# Patient Record
Sex: Female | Born: 1965 | Race: White | Hispanic: No | Marital: Married | Smoking: Never smoker
Health system: Southern US, Community
[De-identification: ages and names within clinical notes are randomized; demographics above are authoritative.]

## PROBLEM LIST (undated history)

## (undated) HISTORY — PX: BACK SURGERY: SHX140

## (undated) HISTORY — PX: ABDOMINAL HYSTERECTOMY: SHX81

---

## 2010-03-05 ENCOUNTER — Emergency Department: Payer: Self-pay | Admitting: Unknown Physician Specialty

## 2010-04-19 ENCOUNTER — Emergency Department: Payer: Self-pay | Admitting: Internal Medicine

## 2010-04-26 ENCOUNTER — Emergency Department: Payer: Self-pay | Admitting: Emergency Medicine

## 2010-05-13 ENCOUNTER — Emergency Department: Payer: Self-pay | Admitting: Unknown Physician Specialty

## 2010-06-19 ENCOUNTER — Emergency Department: Payer: Self-pay | Admitting: Emergency Medicine

## 2010-06-29 ENCOUNTER — Emergency Department: Payer: Self-pay | Admitting: Emergency Medicine

## 2010-09-13 ENCOUNTER — Emergency Department: Payer: Self-pay | Admitting: Emergency Medicine

## 2011-06-09 IMAGING — CR DG LUMBAR SPINE 2-3V
1 series · 3 of 3 positions shown · non-contrast
Comparison: none

REASON FOR EXAM: fall with lower back pain
COMMENTS:

[Series 1: view not recorded · 0.17mm/px · 3 of 3 slices shown]
[im 1/3]
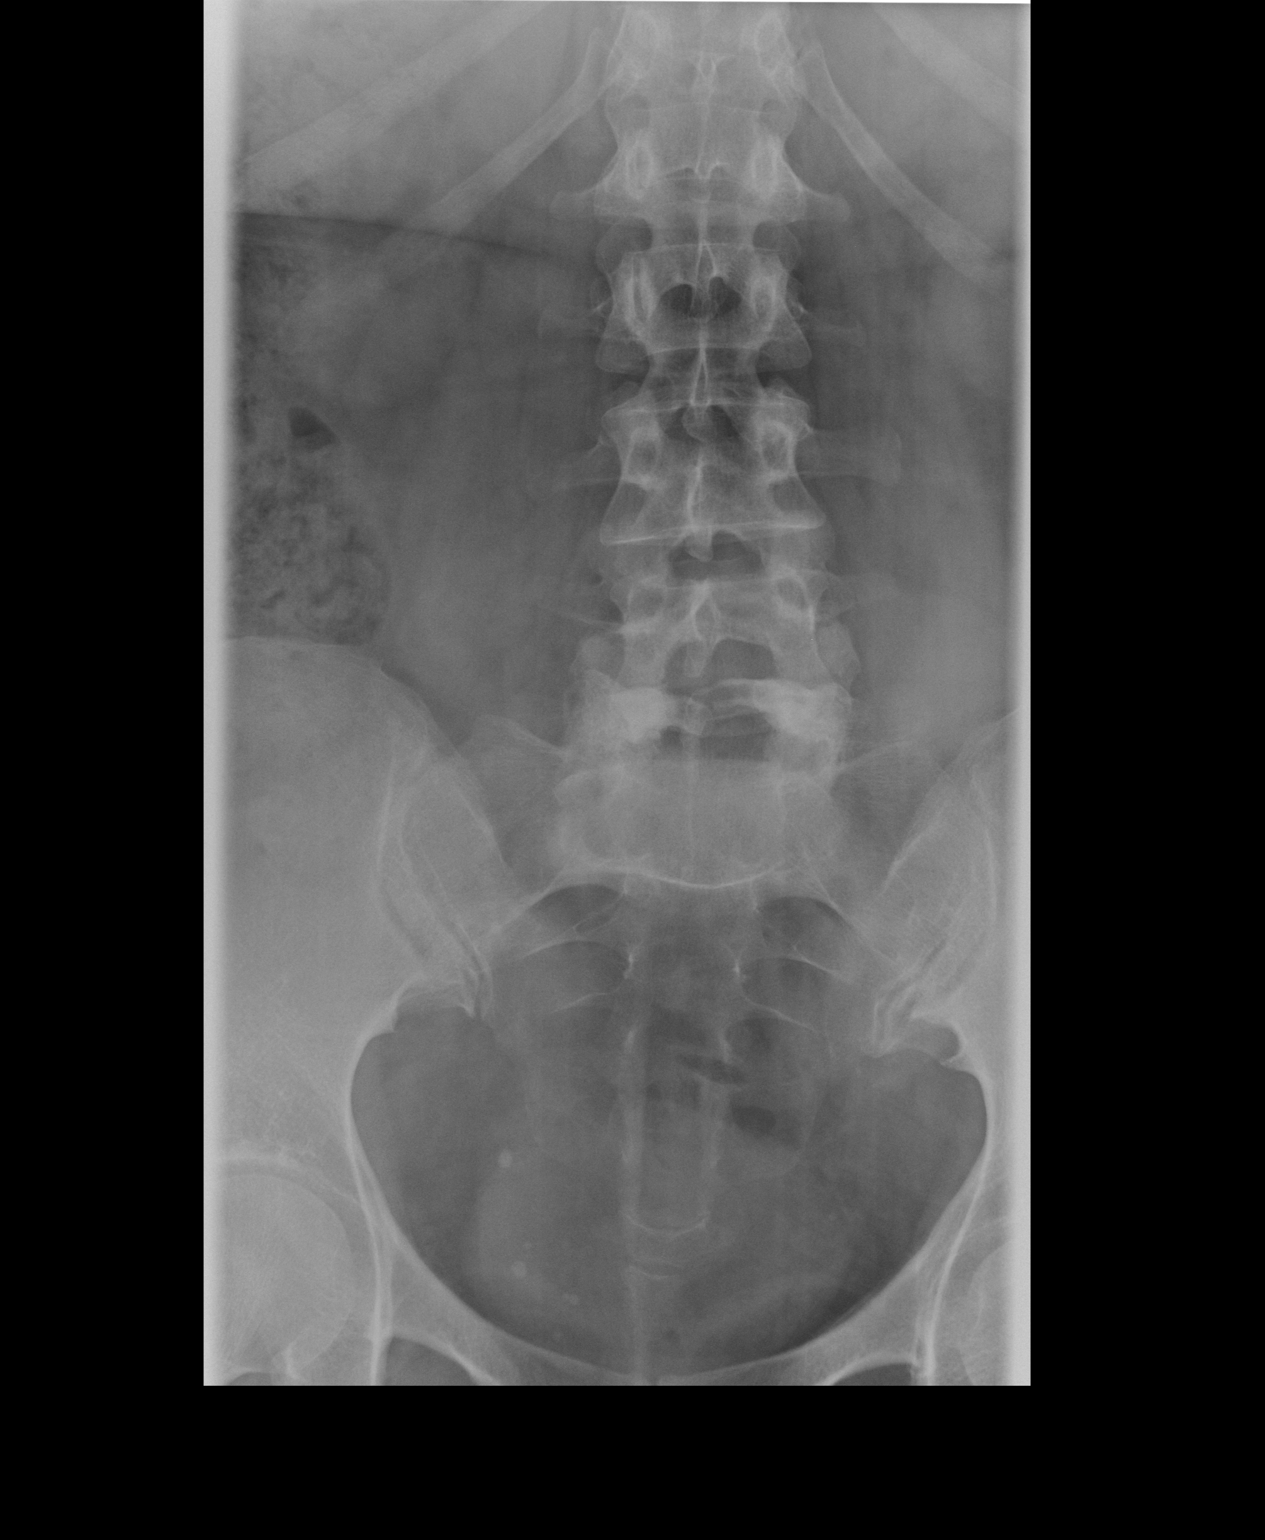
[im 2/3]
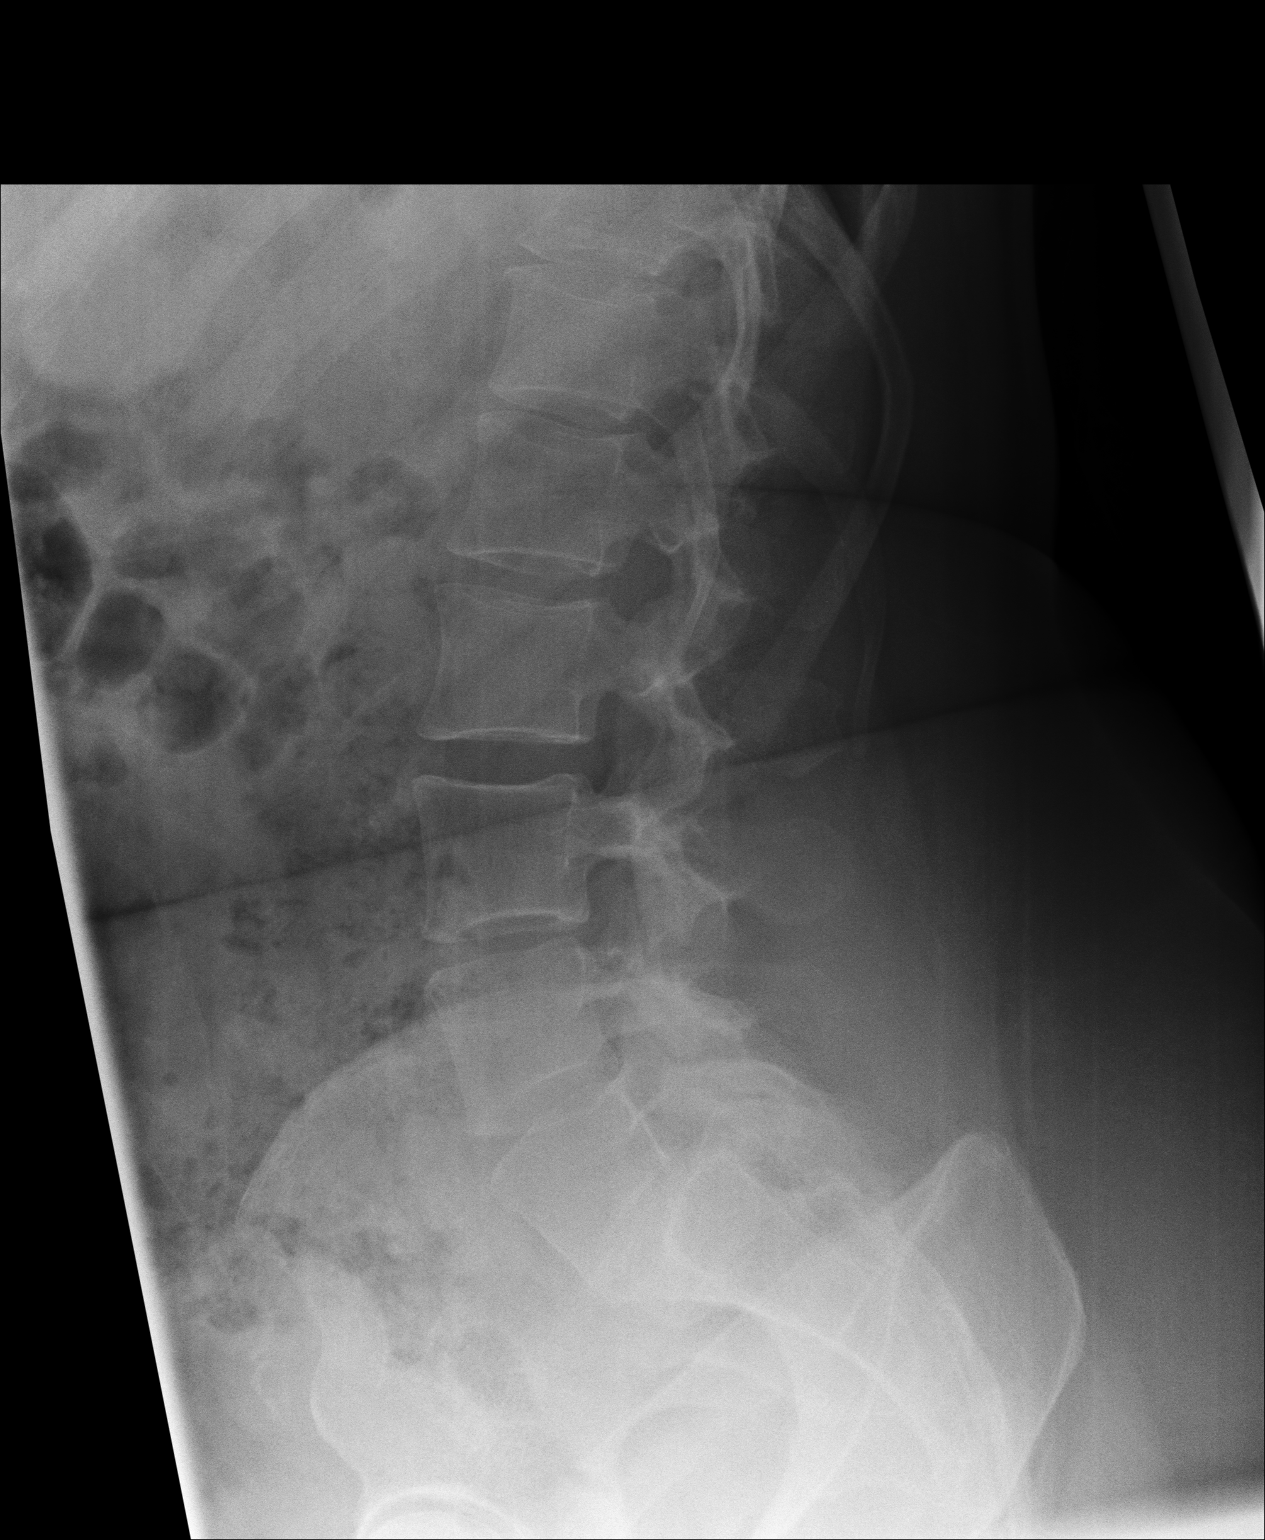
[im 3/3]
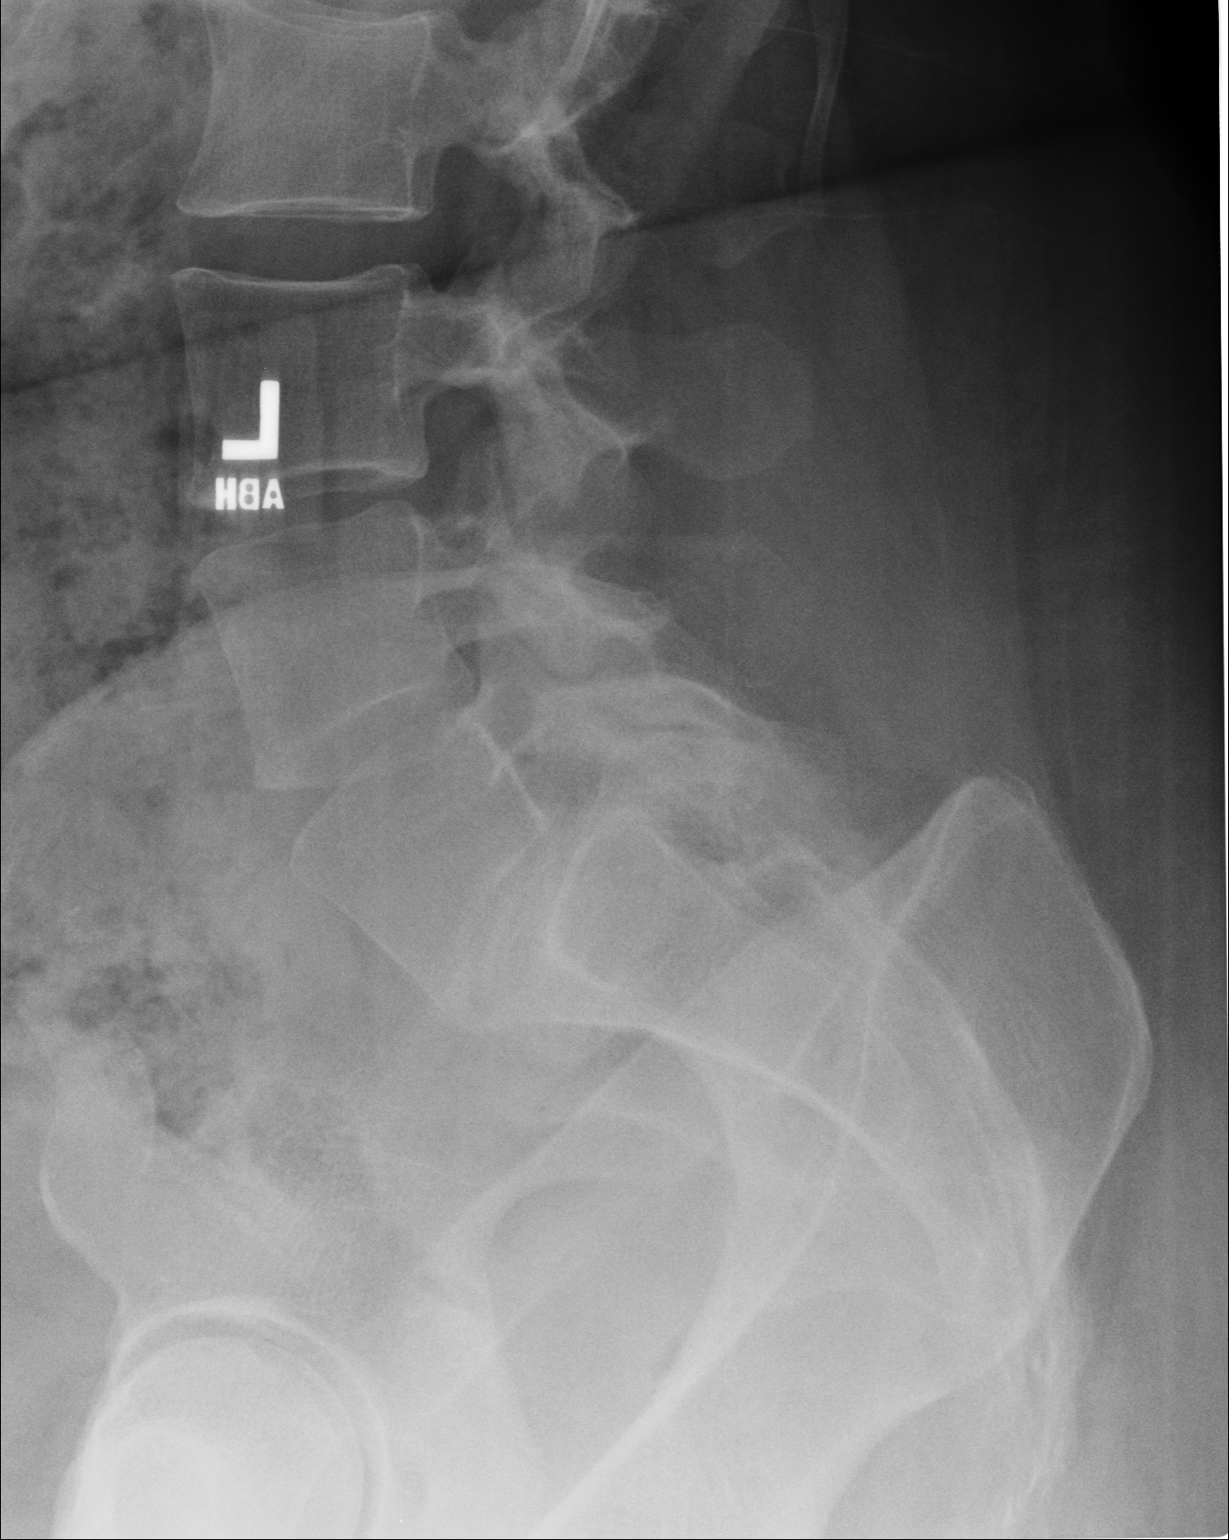

[3 of 3 positions shown; findings below may reference images not displayed]

PROCEDURE:     DXR - DXR LUMBAR SPINE AP AND LATERAL  - June 19, 2010 [DATE]

RESULT:     Spinal alignment appears to be maintained on the AP view. There
is, however, anterolisthesis of L5 on S1 which appears to be unchanged
compared to images of 05/13/2010.. The vertebral body heights and
intervertebral disc spaces appear to be maintained except for disc space
narrowing at the L5-S1 level similar to that seen previously.
IMPRESSION: Please see above. No acute bony abnormality evident. MRI is
available for followup if desired..

## 2013-09-18 ENCOUNTER — Emergency Department: Payer: Self-pay | Admitting: Emergency Medicine

## 2015-06-18 ENCOUNTER — Emergency Department
Admission: EM | Admit: 2015-06-18 | Discharge: 2015-06-18 | Disposition: A | Discharge status: Home / Self Care | Payer: Self-pay | Attending: Emergency Medicine | Admitting: Emergency Medicine

## 2015-06-18 ENCOUNTER — Emergency Department: Payer: Self-pay

## 2015-06-18 DIAGNOSIS — S92501A Displaced unspecified fracture of right lesser toe(s), initial encounter for closed fracture: Secondary | ICD-10-CM

## 2015-06-18 DIAGNOSIS — Y9289 Other specified places as the place of occurrence of the external cause: Secondary | ICD-10-CM | POA: Insufficient documentation

## 2015-06-18 DIAGNOSIS — Y9389 Activity, other specified: Secondary | ICD-10-CM | POA: Insufficient documentation

## 2015-06-18 DIAGNOSIS — Y998 Other external cause status: Secondary | ICD-10-CM | POA: Insufficient documentation

## 2015-06-18 DIAGNOSIS — W208XXA Other cause of strike by thrown, projected or falling object, initial encounter: Secondary | ICD-10-CM | POA: Insufficient documentation

## 2015-06-18 DIAGNOSIS — S90121A Contusion of right lesser toe(s) without damage to nail, initial encounter: Secondary | ICD-10-CM | POA: Insufficient documentation

## 2015-06-18 DIAGNOSIS — S92591A Other fracture of right lesser toe(s), initial encounter for closed fracture: Secondary | ICD-10-CM | POA: Insufficient documentation

## 2015-06-18 MED ORDER — HYDROCODONE-ACETAMINOPHEN 5-325 MG PO TABS
1.0000 | ORAL_TABLET | Freq: Once | ORAL | Status: AC
  Administered 2015-06-18: 1 via ORAL
  Filled 2015-06-18: qty 1

## 2015-06-18 MED ORDER — HYDROCODONE-ACETAMINOPHEN 5-325 MG PO TABS: 1.0000 | ORAL_TABLET | ORAL | Status: DC | PRN

## 2015-06-18 NOTE — ED Notes (Signed)
Patient dropped nightstand on right pinkie toe last night.

## 2015-06-18 NOTE — ED Provider Notes (Signed)
[facility] Emergency Department Provider Note  ____________________________________________  Time seen: Approximately 9:48 PM  I have reviewed the triage vital signs and the nursing notes.   HISTORY  Chief Complaint Toe Injury   HPI Kristen Luna is a 50 y.o. female is here with complaint of right fifth toe pain. Patient states she dropped night stand on her fifth toe last evening. She has used ice and heat to her toe without any relief of pain. She is also taking some Tylenol this evening without any relief with the last dose being at 6 PM. She denies any previous fractures to her today. Currently she rates her pain as a 9 out of 10.   History reviewed. No pertinent past medical history.  There are no active problems to display for this patient.   Past Surgical History  Procedure Laterality Date  . Abdominal hysterectomy    . Back surgery      Current Outpatient Rx  Name  Route  Sig  Dispense  Refill  . HYDROcodone-acetaminophen (NORCO/VICODIN) 5-325 MG tablet   Oral   Take 1 tablet by mouth every 4 (four) hours as needed for moderate pain.   20 tablet   0     Allergies Review of patient's allergies indicates no known allergies.  No family history on file.  Social History Social History  Substance Use Topics  . Smoking status: Never Smoker   . Smokeless tobacco: Never Used  . Alcohol Use: No    Review of Systems Constitutional: No fever/chills Cardiovascular: Denies chest pain. Respiratory: Denies shortness of breath. Gastrointestinal:   No nausea, no vomiting.  Musculoskeletal: Positive right fifth toe pain Skin: Negative for rash. Neurological: Negative for headaches, focal weakness or numbness.  10-point ROS otherwise negative.  ____________________________________________   PHYSICAL EXAM:  VITAL SIGNS: ED Triage Vitals  Enc Vitals Group     BP 06/18/15 2105 135/78 mmHg     Pulse Rate 06/18/15 2105 79     Resp  06/18/15 2105 16     Temp 06/18/15 2105 97.7 F (36.5 C)     Temp Source 06/18/15 2105 Oral     SpO2 06/18/15 2105 98 %     Weight 06/18/15 2105 290 lb (131.543 kg)     Height 06/18/15 2105  (1.753 m)     Head Cir --      Peak Flow --      Pain Score 06/18/15 2106 9     Pain Loc --      Pain Edu? --      Excl. in GC? --     Constitutional: Alert and oriented. Well appearing and in no acute distress. Eyes: Conjunctivae are normal. PERRL. EOMI. Head: Atraumatic. Nose: No congestion/rhinnorhea. Neck: No stridor.   Cardiovascular: Normal rate, regular rhythm. Grossly normal heart sounds.  Good peripheral circulation. Respiratory: Normal respiratory effort.  No retractions. Lungs CTAB. Musculoskeletal: Right fifth toe is swollen, tender, ecchymotic. There is no gross deformity and alignment is good. Neurologic:  Normal speech and language. No gross focal neurologic deficits are appreciated. No gait instability. Skin:  Skin is warm, dry and intact. Ecchymosis and edema as noted above. Psychiatric: Mood and affect are normal. Speech and behavior are normal.  ____________________________________________   LABS (all labs ordered are listed, but only abnormal results are displayed)  Labs Reviewed - No data to display   RADIOLOGY  X-ray right fifth toe shows comminuted fracture of the distal phalanx of the fifth  toe per radiologist and reviewed by me. I, Tommi Rumps, personally viewed and evaluated these images (plain radiographs) as part of my medical decision making, as well as reviewing the written report by the radiologist. ____________________________________________   PROCEDURES  Procedure(s) performed: None  Critical Care performed: No  ____________________________________________   INITIAL IMPRESSION / ASSESSMENT AND PLAN / ED COURSE  Pertinent labs & imaging results that were available during my care of the patient were reviewed by me and considered in my  medical decision making (see chart for details).  Patient was given a prescription for Norco as needed for pain. She is encouraged to ice and elevate as needed for swelling and pain. Buddy tape was applied and she was placed in a postop shoe. She is to follow-up with Dr. Orland Jarred if any continued problems. ____________________________________________   FINAL CLINICAL IMPRESSION(S) / ED DIAGNOSES  Final diagnoses:  Fracture of fifth toe, right, closed, initial encounter      Tommi Rumps, PA-C 06/18/15 2302  Jene Every, MD 06/18/15 2359

## 2015-06-18 NOTE — Discharge Instructions (Signed)
Ice and elevate as needed for swelling. Buddy taped toes for support Wear wooden shoe until able to wear your shoes.

## 2015-09-26 ENCOUNTER — Encounter: Payer: Self-pay | Admitting: Medical Oncology

## 2015-09-26 ENCOUNTER — Emergency Department
Admission: EM | Admit: 2015-09-26 | Discharge: 2015-09-26 | Disposition: A | Discharge status: Home / Self Care | Payer: Self-pay | Attending: Emergency Medicine | Admitting: Emergency Medicine

## 2015-09-26 DIAGNOSIS — M5441 Lumbago with sciatica, right side: Secondary | ICD-10-CM | POA: Insufficient documentation

## 2015-09-26 DIAGNOSIS — M5431 Sciatica, right side: Secondary | ICD-10-CM

## 2015-09-26 MED ORDER — MELOXICAM 15 MG PO TABS: 15.0000 mg | ORAL_TABLET | Freq: Every day | ORAL | Status: DC

## 2015-09-26 MED ORDER — PREDNISONE 10 MG (21) PO TBPK: ORAL_TABLET | ORAL | Status: DC

## 2015-09-26 MED ORDER — OXYCODONE-ACETAMINOPHEN 5-325 MG PO TABS: 1.0000 | ORAL_TABLET | Freq: Four times a day (QID) | ORAL | Status: DC | PRN

## 2015-09-26 NOTE — ED Notes (Signed)
Rt sided lower back pain with radiation of pain into rt leg x 2 weeks.

## 2015-09-26 NOTE — ED Provider Notes (Signed)
[facility] Emergency Department Provider Note ____________________________________________  Time seen: Approximately 4:26 PM  I have reviewed the triage vital signs and the nursing notes.   HISTORY  Chief Complaint Back Pain    HPI Kristen Luna is a 50 y.o. female who presents to the emergency department for sudden onset of right lower back pain with radiation into the right buttock and down to her right foot. Symptoms started approximately 2 weeks ago and have not been relieved with heating pad, ice pack, or warm soaks. She is also taken Tylenol and ibuprofen without relief. She states that she works 7 days a week and stands on her feet multiple hours per day. She reports having a history of back surgery in 2004 related to multiple herniated disks. She denies new injury.  History reviewed. No pertinent past medical history.  There are no active problems to display for this patient.   Past Surgical History  Procedure Laterality Date  . Abdominal hysterectomy    . Back surgery      Current Outpatient Rx  Name  Route  Sig  Dispense  Refill  . meloxicam (MOBIC) 15 MG tablet   Oral   Take 1 tablet (15 mg total) by mouth daily.   30 tablet   0   . oxyCODONE-acetaminophen (ROXICET) 5-325 MG tablet   Oral   Take 1 tablet by mouth every 6 (six) hours as needed.   12 tablet   0   . predniSONE (STERAPRED UNI-PAK 21 TAB) 10 MG (21) TBPK tablet      Take 6 tablets on day 1 Take 5 tablets on day 2 Take 4 tablets on day 3 Take 3 tablets on day 4 Take 2 tablets on day 5 Take 1 tablet on day 6   21 tablet   0     Allergies Review of patient's allergies indicates no known allergies.  No family history on file.  Social History Social History  Substance Use Topics  . Smoking status: Never Smoker   . Smokeless tobacco: Never Used  . Alcohol Use: No    Review of Systems Constitutional: No recent illness. Cardiovascular: Denies chest pain or  palpitations. Respiratory: Denies shortness of breath. Musculoskeletal: Pain in Right lower back with radiation to the right foot. Skin: Negative for rash, wound, lesion. Neurological: Negative for focal weakness or numbness.  ____________________________________________   PHYSICAL EXAM:  VITAL SIGNS: ED Triage Vitals  Enc Vitals Group     BP 09/26/15 1600 141/82 mmHg     Pulse Rate 09/26/15 1600 76     Resp 09/26/15 1600 18     Temp 09/26/15 1600 97.9 F (36.6 C)     Temp Source 09/26/15 1600 Oral     SpO2 09/26/15 1600 97 %     Weight 09/26/15 1558 290 lb (131.543 kg)     Height --      Head Cir --      Peak Flow --      Pain Score 09/26/15 1558 8     Pain Loc --      Pain Edu? --      Excl. in GC? --     Constitutional: Alert and oriented. Well appearing and in no acute distress. Eyes: Conjunctivae are normal. EOMI. Head: Atraumatic. Neck: No stridor.  Respiratory: Normal respiratory effort.   Musculoskeletal: Straight leg raise positive at approximately the 30. Otherwise, range of motion is normal. Neurologic:  Normal speech and language. No gross focal  neurologic deficits are appreciated. Speech is normal. No gait instability. Skin:  Skin is warm, dry and intact. Atraumatic. Psychiatric: Mood and affect are normal. Speech and behavior are normal.  ____________________________________________   LABS (all labs ordered are listed, but only abnormal results are displayed)  Labs Reviewed - No data to display ____________________________________________  RADIOLOGY  Not indicated ____________________________________________   PROCEDURES  Procedure(s) performed: None   ____________________________________________   INITIAL IMPRESSION / ASSESSMENT AND PLAN / ED COURSE  Pertinent labs & imaging results that were available during my care of the patient were reviewed by me and considered in my medical decision making (see chart for details).  Patient will  receive a prescription for prednisone taper dose pack, meloxicam, and Roxicodone. She was encouraged to rest ice and elevate the right lower extremity for the next couple of days. She was encouraged to follow-up with orthopedics for symptoms that are not improving over the next 2 weeks. She was advised to return to the emergency department for symptoms that change or worsen if she is unable schedule an appointment with orthopedics or primary care provider. ____________________________________________   FINAL CLINICAL IMPRESSION(S) / ED DIAGNOSES  Final diagnoses:  Sciatica, right side       Chinita Pester, FNP 09/26/15 1631  Jene Every, MD 09/26/15 1949

## 2015-10-14 ENCOUNTER — Emergency Department
Admission: EM | Admit: 2015-10-14 | Discharge: 2015-10-14 | Disposition: A | Discharge status: Home / Self Care | Payer: Self-pay | Attending: Emergency Medicine | Admitting: Emergency Medicine

## 2015-10-14 ENCOUNTER — Emergency Department: Payer: Self-pay

## 2015-10-14 ENCOUNTER — Encounter: Payer: Self-pay | Admitting: Emergency Medicine

## 2015-10-14 DIAGNOSIS — M5441 Lumbago with sciatica, right side: Secondary | ICD-10-CM | POA: Insufficient documentation

## 2015-10-14 MED ORDER — METHOCARBAMOL 500 MG PO TABS
750.0000 mg | ORAL_TABLET | Freq: Once | ORAL | Status: AC
  Administered 2015-10-14: 750 mg via ORAL
  Filled 2015-10-14: qty 2

## 2015-10-14 MED ORDER — OXYCODONE-ACETAMINOPHEN 5-325 MG PO TABS: 1.0000 | ORAL_TABLET | Freq: Four times a day (QID) | ORAL | Status: DC | PRN

## 2015-10-14 MED ORDER — OXYCODONE-ACETAMINOPHEN 5-325 MG PO TABS
1.0000 | ORAL_TABLET | Freq: Once | ORAL | Status: AC
  Administered 2015-10-14: 1 via ORAL
  Filled 2015-10-14: qty 1

## 2015-10-14 MED ORDER — PREDNISONE 10 MG (21) PO TBPK: ORAL_TABLET | ORAL | Status: DC

## 2015-10-14 NOTE — ED Provider Notes (Signed)
[facility] Emergency Department Provider Note  ____________________________________________  Time seen: Approximately 4:02 PM  I have reviewed the triage vital signs and the nursing notes.   HISTORY  Chief Complaint Back Pain    HPI Kristen ManeLisa Luna is a 50 y.o. female who presents with worsening back pain over the last couple days. She was seen 2 weeks ago for similar complaint and improved with prednisone taper.  She was helping someone move yesterday and symptoms worsened again. Right lower back pain with radiation down the right leg. She has a history of back surgery in 2004 for herniated disc. No current fevers or chills, abdominal pain, urinary symptoms, chest pain, shortness of breath.   History reviewed. No pertinent past medical history.  There are no active problems to display for this patient.   Past Surgical History  Procedure Laterality Date  . Abdominal hysterectomy    . Back surgery      Current Outpatient Rx  Name  Route  Sig  Dispense  Refill  . meloxicam (MOBIC) 15 MG tablet   Oral   Take 1 tablet (15 mg total) by mouth daily.   30 tablet   0   . oxyCODONE-acetaminophen (ROXICET) 5-325 MG tablet   Oral   Take 1 tablet by mouth every 6 (six) hours as needed.   20 tablet   0   . predniSONE (STERAPRED UNI-PAK 21 TAB) 10 MG (21) TBPK tablet      6 tablets on day 1, 5 tablets on day 2, 4 tablets on day 3, etc...   21 tablet   0     Allergies Review of patient's allergies indicates no known allergies.  No family history on file.  Social History Social History  Substance Use Topics  . Smoking status: Never Smoker   . Smokeless tobacco: Never Used  . Alcohol Use: No    Review of Systems Constitutional: No fever/chills Eyes: No visual changes. ENT: No sore throat. Cardiovascular: Denies chest pain. Respiratory: Denies shortness of breath. Gastrointestinal: No abdominal pain.  No nausea, no vomiting.  No diarrhea.  No  constipation. Genitourinary: Negative for dysuria. Musculoskeletal:per HPI Skin: Negative for rash. Neurological: Negative for headaches, focal weakness or numbness. 10-point ROS otherwise negative.  ____________________________________________   PHYSICAL EXAM:  VITAL SIGNS: ED Triage Vitals  Enc Vitals Group     BP 10/14/15 1531 131/81 mmHg     Pulse Rate 10/14/15 1531 82     Resp 10/14/15 1531 20     Temp 10/14/15 1531 98.1 F (36.7 C)     Temp Source 10/14/15 1531 Oral     SpO2 10/14/15 1531 98 %     Weight 10/14/15 1531 293 lb (132.904 kg)     Height 10/14/15 1531 5\' 8"  (1.727 m)     Head Cir --      Peak Flow --      Pain Score 10/14/15 1544 7     Pain Loc --      Pain Edu? --      Excl. in GC? --     Constitutional: Alert and oriented. Well appearing and in no acute distress. Eyes: Conjunctivae are normal. PERRL. EOMI. Ears:  Clear with normal landmarks. No erythema. Head: Atraumatic. Nose: No congestion/rhinnorhea. Mouth/Throat: Mucous membranes are moist.  Oropharynx non-erythematous. No lesions. Neck:  Supple.  No adenopathy.   Cardiovascular: Normal rate, regular rhythm. Grossly normal heart sounds.  Good peripheral circulation. Respiratory: Normal respiratory effort.  No retractions.  Lungs CTAB. Musculoskeletal:    tender over the lumbar spine and paraspinal muscles on right.  rom intact.  Neg SLR bilateral. nontender over the greater trochanter bilateral. Neurologic:  Normal speech and language. No gross focal neurologic deficits are appreciated. No gait instability. Skin:  Skin is warm, dry and intact. No rash noted. Psychiatric: Mood and affect are normal. Speech and behavior are normal.  ____________________________________________   LABS (all labs ordered are listed, but only abnormal results are displayed)  Labs Reviewed - No data to  display ____________________________________________  EKG   ____________________________________________  RADIOLOGY  CLINICAL DATA: Progressive lumbago with right-sided radicular symptoms for approximately 2 week  EXAM: LUMBAR SPINE - 2-3 VIEW  COMPARISON: June 19, 2010  FINDINGS: Frontal, lateral, and spot lumbosacral lateral images were obtained. There are 5 non-rib-bearing lumbar type vertebral bodies. There is no fracture. There is persistent 6 mm of anterolisthesis of L5 on S1. No other spondylolisthesis is evident. There is stable disc space narrowing at L3-4, L4-5, and L5-S1. No erosion identified.  IMPRESSION: Disc space narrowing at L3-4, L4-5, and L5-S1, stable. Stable anterolisthesis of L5 on S1, likely due to underlying spondylosis. No fracture evident.   Electronically Signed  By: Bretta Bang III M.D.  On: 10/14/2015 16:23  ____________________________________________   PROCEDURES  Procedure(s) performed: None  Critical Care performed: No  ____________________________________________   INITIAL IMPRESSION / ASSESSMENT AND PLAN / ED COURSE  Pertinent labs & imaging results that were available during my care of the patient were reviewed by me and considered in my medical decision making (see chart for details).  50 year old with acute right lower back pain. Was seen 2 weeks ago for the same. Her symptoms didn't improve, however worsened again after helping a friend move yesterday. Stable x-rays today. Given a prednisone taper. Encouraged follow-up with orthopedist. ____________________________________________   FINAL CLINICAL IMPRESSION(S) / ED DIAGNOSES  Final diagnoses:  Right-sided low back pain with right-sided sciatica      Ignacia Bayley, PA-C 10/14/15 1652  Sharman Cheek, MD 10/15/15 0011

## 2015-10-14 NOTE — ED Notes (Signed)
Right back pain rad down leg.

## 2015-10-14 NOTE — ED Notes (Signed)
Pt verbalized understanding of discharge instructions. NAD at this time. 

## 2015-10-14 NOTE — Discharge Instructions (Signed)

## 2015-11-12 ENCOUNTER — Emergency Department
Admission: EM | Admit: 2015-11-12 | Discharge: 2015-11-12 | Disposition: A | Discharge status: Home / Self Care | Payer: Self-pay | Attending: Emergency Medicine | Admitting: Emergency Medicine

## 2015-11-12 ENCOUNTER — Encounter: Payer: Self-pay | Admitting: Emergency Medicine

## 2015-11-12 DIAGNOSIS — M545 Low back pain: Secondary | ICD-10-CM | POA: Insufficient documentation

## 2015-11-12 DIAGNOSIS — G8929 Other chronic pain: Secondary | ICD-10-CM | POA: Insufficient documentation

## 2015-11-12 MED ORDER — KETOROLAC TROMETHAMINE 30 MG/ML IJ SOLN
30.0000 mg | Freq: Once | INTRAMUSCULAR | Status: DC
  Filled 2015-11-12: qty 1

## 2015-11-12 MED ORDER — ETODOLAC 400 MG PO TABS: 400.0000 mg | ORAL_TABLET | Freq: Two times a day (BID) | ORAL | Status: AC

## 2015-11-12 MED ORDER — METHOCARBAMOL 500 MG PO TABS: ORAL_TABLET | ORAL | Status: AC

## 2015-11-12 MED ORDER — METHOCARBAMOL 500 MG PO TABS
1000.0000 mg | ORAL_TABLET | Freq: Once | ORAL | Status: AC
  Administered 2015-11-12: 1000 mg via ORAL
  Filled 2015-11-12: qty 2

## 2015-11-12 MED ORDER — KETOROLAC TROMETHAMINE 30 MG/ML IJ SOLN
30.0000 mg | Freq: Once | INTRAMUSCULAR | Status: AC
  Administered 2015-11-12: 30 mg via INTRAMUSCULAR

## 2015-11-12 NOTE — Discharge Instructions (Signed)
[facility]. Moist heat or ice to your back as needed for comfort. Begin taking medication as directed. Etodolac 400 mg twice a day with food and Robaxin one or 2 tablets 4 times a day as needed for muscle spasms. You may also follow-up with Dr. Rosita KeaMenz for your chronic back pain. He is office phone number is also listed on your discharge papers.

## 2015-11-12 NOTE — ED Notes (Signed)

## 2015-11-12 NOTE — ED Provider Notes (Signed)
[facility] Emergency Department Provider Note  ___________________________________________  Time seen: Approximately 1:14 PM  I have reviewed the triage vital signs and the nursing notes.   HISTORY  Chief Complaint Back Pain   HPI Kristen Luna is a 50 y.o. female is here with complaint of low back pain. Patient states that her chronic low back pain began again last evening without injury. She denies any paresthesias or incontinence of bowel or bladder. Patient has a history of sciatica and back pain. [facility] but has not followed up since nor has she seen an [place] in [place]. Patient was seen in the emergency room on 6/16 for the same. At that time she was given prednisone and Percocet. Patient also gives history that she took a prednisone pack prior to that ER visit as well.  Currently she rates her back pain is 9/10. She states she has not followed up with orthopedics or a primary care doctor due to financial burden's. She was ambulatory without assistance in the emergency room.   History reviewed. No pertinent past medical history.  There are no active problems to display for this patient.   Past Surgical History  Procedure Laterality Date  . Abdominal hysterectomy    . Back surgery      Current Outpatient Rx  Name  Route  Sig  Dispense  Refill  . etodolac (LODINE) 400 MG tablet   Oral   Take 1 tablet (400 mg total) by mouth 2 (two) times daily.   20 tablet   0   . methocarbamol (ROBAXIN) 500 MG tablet      1-2 tablets qid prn muscle spasms   30 tablet   0     Allergies Review of patient's allergies indicates no known allergies.  No family history on file.  Social History Social History  Substance Use Topics  . Smoking status: Never Smoker   . Smokeless tobacco: Never Used  . Alcohol Use: No    Review of Systems Constitutional: No fever/chills Cardiovascular:  Denies chest pain. Respiratory: Denies shortness of breath. Gastrointestinal: No abdominal pain.  No nausea, no vomiting.  Genitourinary: Negative for dysuria. Musculoskeletal: Positive for acute and chronic back pain. Skin: Negative for rash. Neurological: Negative for headaches, focal weakness or numbness.  10-point ROS otherwise negative.  ____________________________________________   PHYSICAL EXAM:  VITAL SIGNS: ED Triage Vitals  Enc Vitals Group     BP 11/12/15 1259 143/87 mmHg     Pulse Rate 11/12/15 1259 81     Resp 11/12/15 1259 18     Temp 11/12/15 1259 98.1 F (36.7 C)     Temp Source 11/12/15 1259 Oral     SpO2 11/12/15 1259 100 %     Weight 11/12/15 1259 193 lb (87.544 kg)     Height 11/12/15 1259 5\' 9"  (1.753 m)     Head Cir --      Peak Flow --      Pain Score 11/12/15 1301 9     Pain Loc --      Pain Edu? --      Excl. in GC? --     Constitutional: Alert and oriented. Well appearing and in no acute distress.Patient sitting on the stretcher. Patient morbidly obese. Eyes: Conjunctivae are normal. PERRL. EOMI. Head: Atraumatic. Nose: No congestion/rhinnorhea. Neck: No stridor.   Cardiovascular: Normal rate, regular rhythm. Grossly normal heart sounds.  Good peripheral circulation.  Respiratory: Normal respiratory effort.  No retractions. Lungs CTAB. Gastrointestinal: Soft and nontender. No distention. No CVA tenderness. Musculoskeletal: On examination of the back there is no gross deformity. There is tenderness on palpation of the L5-S1 and paravertebral muscles bilaterally.  Straight leg raises are approximately 90 bilaterally without discomfort.  Good muscle strength and tone bilaterally. Neurologic:  Normal speech and language. No gross focal neurologic deficits are appreciated. No gait instability. Reflexes are 2+ bilaterally. Skin:  Skin is warm, dry and intact. No rash noted. Psychiatric: Mood and affect are normal. Speech and behavior are  normal.  ____________________________________________   LABS (all labs ordered are listed, but only abnormal results are displayed)  Labs Reviewed - No data to display   RADIOLOGY  Report reviewed from 10/14/15 ____________________________________________   PROCEDURES  Procedure(s) performed: None  Procedures  Critical Care performed: No  ____________________________________________   INITIAL IMPRESSION / ASSESSMENT AND PLAN / ED COURSE  Pertinent labs & imaging results that were available during my care of the patient were reviewed by me and considered in my medical decision making (see chart for details).  Patient was given Robaxin 1000 mg by mouth and Toradol 30 mg IM while in the emergency room and received relief. Patient was discharged with a prescription for methocarbamol one or 2 tablets every 6 hours as needed for muscle spasms and etodolac 400 mg twice a day with food. Patient was given [facility] She is to call Monday for more information. Ideally she should be following up with orthopedics but states that financially she is unable. ____________________________________________   FINAL CLINICAL IMPRESSION(S) / ED DIAGNOSES  Final diagnoses:  Chronic low back pain      NEW MEDICATIONS STARTED DURING THIS VISIT:  Discharge Medication List as of 11/12/2015  2:38 PM    START taking these medications   Details  etodolac (LODINE) 400 MG tablet Take 1 tablet (400 mg total) by mouth 2 (two) times daily., Starting 11/12/2015, Until Discontinued, Print    methocarbamol (ROBAXIN) 500 MG tablet 1-2 tablets qid prn muscle spasms, Print         Note:  This document was prepared using Dragon voice recognition software and may include unintentional dictation errors.    Tommi Rumps, PA-C 11/12/15 1540  Nita Sickle, MD 11/12/15 2028

## 2015-11-12 NOTE — ED Notes (Addendum)
Back pain down both legs. Since last night. Has hx of sciatica. Doesn't have insurance so no pcp or ortho dr. Rochele Pagesook 4 tylenol at 4am and motrin at 11am

## 2015-11-12 NOTE — ED Notes (Signed)
Back pain increasing since yesterday, radiating to both legs. Denies injury.

## 2017-02-04 ENCOUNTER — Emergency Department
Admission: EM | Admit: 2017-02-04 | Discharge: 2017-02-04 | Disposition: A | Discharge status: Home / Self Care | Payer: Self-pay | Attending: Emergency Medicine | Admitting: Emergency Medicine

## 2017-02-04 ENCOUNTER — Encounter: Payer: Self-pay | Admitting: Physician Assistant

## 2017-02-04 DIAGNOSIS — Y999 Unspecified external cause status: Secondary | ICD-10-CM | POA: Insufficient documentation

## 2017-02-04 DIAGNOSIS — Y92410 Unspecified street and highway as the place of occurrence of the external cause: Secondary | ICD-10-CM | POA: Insufficient documentation

## 2017-02-04 DIAGNOSIS — S161XXA Strain of muscle, fascia and tendon at neck level, initial encounter: Secondary | ICD-10-CM

## 2017-02-04 DIAGNOSIS — Y9389 Activity, other specified: Secondary | ICD-10-CM | POA: Insufficient documentation

## 2017-02-04 DIAGNOSIS — Z79899 Other long term (current) drug therapy: Secondary | ICD-10-CM | POA: Insufficient documentation

## 2017-02-04 DIAGNOSIS — G44209 Tension-type headache, unspecified, not intractable: Secondary | ICD-10-CM

## 2017-02-04 MED ORDER — CYCLOBENZAPRINE HCL 5 MG PO TABS: 5.0000 mg | ORAL_TABLET | Freq: Three times a day (TID) | ORAL | 0 refills | Status: AC | PRN

## 2017-02-04 MED ORDER — NABUMETONE 750 MG PO TABS: 750.0000 mg | ORAL_TABLET | Freq: Two times a day (BID) | ORAL | 0 refills | Status: AC

## 2017-02-04 MED ORDER — METOCLOPRAMIDE HCL 10 MG PO TABS
10.0000 mg | ORAL_TABLET | Freq: Once | ORAL | Status: AC
  Administered 2017-02-04: 10 mg via ORAL
  Filled 2017-02-04: qty 1

## 2017-02-04 MED ORDER — ORPHENADRINE CITRATE 30 MG/ML IJ SOLN
60.0000 mg | INTRAMUSCULAR | Status: AC
  Administered 2017-02-04: 60 mg via INTRAMUSCULAR
  Filled 2017-02-04: qty 2

## 2017-02-04 MED ORDER — METOCLOPRAMIDE HCL 5 MG PO TABS: 5.0000 mg | ORAL_TABLET | Freq: Three times a day (TID) | ORAL | 0 refills | Status: AC | PRN

## 2017-02-04 MED ORDER — KETOROLAC TROMETHAMINE 60 MG/2ML IM SOLN
30.0000 mg | Freq: Once | INTRAMUSCULAR | Status: AC
  Administered 2017-02-04: 30 mg via INTRAMUSCULAR
  Filled 2017-02-04: qty 2
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
